# Patient Record
Sex: Male | Born: 1945 | Race: Black or African American | Hispanic: No | Smoking: Never smoker
Health system: Southern US, Community
[De-identification: ages and names within clinical notes are randomized; demographics above are authoritative.]

## PROBLEM LIST (undated history)

## (undated) DIAGNOSIS — E119 Type 2 diabetes mellitus without complications: Secondary | ICD-10-CM

## (undated) DIAGNOSIS — G4730 Sleep apnea, unspecified: Secondary | ICD-10-CM

## (undated) DIAGNOSIS — I10 Essential (primary) hypertension: Secondary | ICD-10-CM

---

## 2013-11-17 ENCOUNTER — Emergency Department (HOSPITAL_COMMUNITY)
Admission: EM | Admit: 2013-11-17 | Discharge: 2013-11-18 | Disposition: A | Discharge status: Home / Self Care | Payer: Medicare HMO | Attending: Emergency Medicine | Admitting: Emergency Medicine

## 2013-11-17 ENCOUNTER — Encounter (HOSPITAL_COMMUNITY): Payer: Self-pay | Admitting: Emergency Medicine

## 2013-11-17 DIAGNOSIS — T7809XA Anaphylactic reaction due to other food products, initial encounter: Secondary | ICD-10-CM | POA: Insufficient documentation

## 2013-11-17 DIAGNOSIS — E119 Type 2 diabetes mellitus without complications: Secondary | ICD-10-CM | POA: Insufficient documentation

## 2013-11-17 DIAGNOSIS — Z7982 Long term (current) use of aspirin: Secondary | ICD-10-CM | POA: Insufficient documentation

## 2013-11-17 DIAGNOSIS — I10 Essential (primary) hypertension: Secondary | ICD-10-CM | POA: Insufficient documentation

## 2013-11-17 DIAGNOSIS — R21 Rash and other nonspecific skin eruption: Secondary | ICD-10-CM | POA: Insufficient documentation

## 2013-11-17 DIAGNOSIS — Y9389 Activity, other specified: Secondary | ICD-10-CM | POA: Diagnosis not present

## 2013-11-17 DIAGNOSIS — Z79899 Other long term (current) drug therapy: Secondary | ICD-10-CM | POA: Diagnosis not present

## 2013-11-17 DIAGNOSIS — T628X1A Toxic effect of other specified noxious substances eaten as food, accidental (unintentional), initial encounter: Secondary | ICD-10-CM | POA: Insufficient documentation

## 2013-11-17 DIAGNOSIS — Y92009 Unspecified place in unspecified non-institutional (private) residence as the place of occurrence of the external cause: Secondary | ICD-10-CM | POA: Insufficient documentation

## 2013-11-17 DIAGNOSIS — Z794 Long term (current) use of insulin: Secondary | ICD-10-CM | POA: Diagnosis not present

## 2013-11-17 DIAGNOSIS — T782XXA Anaphylactic shock, unspecified, initial encounter: Secondary | ICD-10-CM

## 2013-11-17 HISTORY — DX: Type 2 diabetes mellitus without complications: E11.9

## 2013-11-17 HISTORY — DX: Essential (primary) hypertension: I10

## 2013-11-17 HISTORY — DX: Sleep apnea, unspecified: G47.30

## 2013-11-17 LAB — BASIC METABOLIC PANEL
ANION GAP: 10 (ref 5–15)
BUN: 17 mg/dL (ref 6–23)
CHLORIDE: 98 meq/L (ref 96–112)
CO2: 28 meq/L (ref 19–32)
CREATININE: 1.05 mg/dL (ref 0.50–1.35)
Calcium: 9 mg/dL (ref 8.4–10.5)
GFR calc Af Amer: 81 mL/min — ABNORMAL LOW (ref >90)
GFR calc non Af Amer: 70 mL/min — ABNORMAL LOW (ref >90)
Glucose, Bld: 217 mg/dL — ABNORMAL HIGH (ref 70–99)
POTASSIUM: 4 meq/L (ref 3.7–5.3)
SODIUM: 136 meq/L — AB (ref 137–147)

## 2013-11-17 LAB — CBC
HCT: 43.7 % (ref 39.0–52.0)
Hemoglobin: 14.5 g/dL (ref 13.0–17.0)
MCH: 29.1 pg (ref 26.0–34.0)
MCHC: 33.2 g/dL (ref 30.0–36.0)
MCV: 87.8 fL (ref 78.0–100.0)
PLATELETS: 169 10*3/uL (ref 150–400)
RBC: 4.98 MIL/uL (ref 4.22–5.81)
RDW: 12.4 % (ref 11.5–15.5)
WBC: 12.9 10*3/uL — AB (ref 4.0–10.5)

## 2013-11-17 LAB — CBG MONITORING, ED: Glucose-Capillary: 161 mg/dL — ABNORMAL HIGH (ref 70–99)

## 2013-11-17 MED ORDER — METHYLPREDNISOLONE SODIUM SUCC 125 MG IJ SOLR
125.0000 mg | Freq: Once | INTRAMUSCULAR | Status: AC
  Administered 2013-11-17: 125 mg via INTRAVENOUS
  Filled 2013-11-17: qty 2

## 2013-11-17 MED ORDER — DIPHENHYDRAMINE HCL 25 MG PO TABS: 25.0000 mg | ORAL_TABLET | Freq: Four times a day (QID) | ORAL | Status: AC | PRN

## 2013-11-17 MED ORDER — EPINEPHRINE 0.3 MG/0.3ML IJ SOAJ
0.3000 mg | Freq: Once | INTRAMUSCULAR | Status: AC
  Administered 2013-11-17: 0.3 mg via INTRAMUSCULAR
  Filled 2013-11-17: qty 0.3

## 2013-11-17 MED ORDER — PREDNISONE 50 MG PO TABS: 50.0000 mg | ORAL_TABLET | Freq: Every day | ORAL | Status: AC

## 2013-11-17 MED ORDER — FAMOTIDINE IN NACL 20-0.9 MG/50ML-% IV SOLN
20.0000 mg | Freq: Once | INTRAVENOUS | Status: AC
  Administered 2013-11-17: 20 mg via INTRAVENOUS
  Filled 2013-11-17: qty 50

## 2013-11-17 MED ORDER — EPINEPHRINE 0.3 MG/0.3ML IJ SOAJ: 0.3000 mg | INTRAMUSCULAR | Status: AC | PRN

## 2013-11-17 MED ORDER — FAMOTIDINE 20 MG PO TABS: 20.0000 mg | ORAL_TABLET | Freq: Two times a day (BID) | ORAL | Status: AC

## 2013-11-17 MED ORDER — SODIUM CHLORIDE 0.9 % IV SOLN
1000.0000 mL | INTRAVENOUS | Status: DC
  Administered 2013-11-17: 1000 mL via INTRAVENOUS

## 2013-11-17 MED ORDER — SODIUM CHLORIDE 0.9 % IV SOLN
1000.0000 mL | Freq: Once | INTRAVENOUS | Status: AC
  Administered 2013-11-17: 1000 mL via INTRAVENOUS

## 2013-11-17 NOTE — ED Notes (Signed)
Patient from in home via EMS. Patient reports eating cold slaw that had poppy seeds and mustard. EMS states that upon arrival that was on the floor clammy, hives noted on bilateral arms and stomach with BP of 70/36 and he felt the room was dark. He was given 500 ml fluid bolus with improvement in pressure. Patient also has swelling to eye and lips. Patient denies SOB, nausea, chest pain, and no LOC. Patient did not report problems swallowing. He was given 50 mg benadryl by family.

## 2013-11-17 NOTE — Discharge Instructions (Signed)
Anaphylactic Reaction °An anaphylactic reaction is a sudden, severe allergic reaction. It affects the whole body. It can be life threatening. You may need to stay in the hospital.  °HOME CARE °· Wear a medical bracelet or necklace that lists your allergy. °· Carry your allergy kit or medicine shot to treat severe allergic reactions with you. These can save your life. °· Do not drive until medicine from your shot has worn off, unless your doctor says it is okay. °· If you have hives or a rash: °¨ Take medicine as told by your doctor. °¨ You may take over-the-counter antihistamine medicine. °¨ Place cold cloths on your skin. Take baths in cool water. Avoid hot baths and hot showers. °GET HELP RIGHT AWAY IF:  °· Your mouth is puffy (swollen), or you have trouble breathing. °· You start making whistling sounds when you breathe (wheezing). °· You have a tight feeling in your chest or throat. °· You have a rash, hives, puffiness, or itching on your body. °· You throw up (vomit) or have watery poop (diarrhea). °· You feel dizzy or pass out (faint). °· You think you are having an allergic reaction. °· You have new symptoms. °This is an emergency. Use your medicine shot or allergy kit as told. Call your local emergency services (911 in [place] Even if you feel better after the shot, you need to go to the hospital emergency department. °MAKE SURE YOU:  °· Understand these instructions. °· Will watch your condition. °· Will get help right away if you are not doing well or get worse. °Document Released: 08/10/2007 Document Revised: 08/23/2011 Document Reviewed: 05/25/2011 °Exit[employer]® Patient Information ©2015 [employer]. This information is not intended to replace advice given to you by your health care provider. Make sure you discuss any questions you have with your health care provider. ° °

## 2013-11-17 NOTE — ED Notes (Signed)
On assessment patient has some swelling to eye and lips. Patient denies any SOB or problem swallowing. He states he has had a reaction like this before in [place] from same food.

## 2013-11-17 NOTE — ED Provider Notes (Signed)
CSN: [id number]     Arrival date & time 11/17/13  2106 History   First MD Initiated Contact with Patient 11/17/13 2134     Chief Complaint  Patient presents with  . Allergic Reaction    HPI After eating dinner he went to take his medications.  He suddenly started having itching in both palms and arms.  He tried to wash his hands but the symptoms persisted.  He felt very flushed and noticed a rash all over.  He tried taking a couple of benadryl.  He started to feel like he was choking.  He became lightheaded and then fell to the floor. EMS was called  He was hypotensive and they gave him iv fluids and transported him to the ED. Past Medical History  Diagnosis Date  . Diabetes mellitus without complication   . Hypertension   . Sleep apnea    History reviewed. No pertinent past surgical history. No family history on file. History  Substance Use Topics  . Smoking status: Never Smoker   . Smokeless tobacco: Not on file  . Alcohol Use: No    Review of Systems  All other systems reviewed and are negative.     Allergies  Review of patient's allergies indicates no known allergies.  Home Medications   Prior to Admission medications   Medication Sig Start Date End Date Taking? Authorizing Provider  aspirin 325 MG tablet Take 325 mg by mouth daily.   Yes Historical Provider, MD  atenolol (TENORMIN) 50 MG tablet Take 50 mg by mouth daily.   Yes Historical Provider, MD  beclomethasone (QVAR) 80 MCG/ACT inhaler Inhale 2 puffs into the lungs 2 (two) times daily.   Yes Historical Provider, MD  hydrochlorothiazide (MICROZIDE) 12.5 MG capsule Take 12.5 mg by mouth 2 (two) times daily.   Yes Historical Provider, MD  insulin NPH Human (HUMULIN N,NOVOLIN N) 100 UNIT/ML injection Inject 10-15 Units into the skin 2 (two) times daily before a meal. 15 units in the am in 10 units in the pm   Yes Historical Provider, MD  lisinopril (PRINIVIL,ZESTRIL) 10 MG tablet Take 10 mg by mouth daily.   Yes  Historical Provider, MD  lovastatin (MEVACOR) 20 MG tablet Take 20 mg by mouth daily.   Yes Historical Provider, MD  ranitidine (ZANTAC) 150 MG tablet Take 150 mg by mouth daily.   Yes Historical Provider, MD  tetrahydrozoline 0.05 % ophthalmic solution Place 1 drop into both eyes 2 (two) times daily as needed (dry eyes).   Yes Historical Provider, MD  diphenhydrAMINE (BENADRYL) 25 MG tablet Take 1 tablet (25 mg total) by mouth every 6 (six) hours as needed. 11/17/13   Linwood Dibbles, MD  EPINEPHrine (EPIPEN) 0.3 mg/0.3 mL IJ SOAJ injection Inject 0.3 mLs (0.3 mg total) into the muscle as needed. 11/17/13   Linwood Dibbles, MD  famotidine (PEPCID) 20 MG tablet Take 1 tablet (20 mg total) by mouth 2 (two) times daily. 11/17/13   Linwood Dibbles, MD  predniSONE (DELTASONE) 50 MG tablet Take 1 tablet (50 mg total) by mouth daily. 11/17/13   Linwood Dibbles, MD   BP 137/62  Pulse 71  Temp(Src) 98.1 F (36.7 C) (Oral)  Resp 15  SpO2 100% Physical Exam  Nursing note and vitals reviewed. Constitutional: He appears well-nourished. No distress.  HENT:  Head: Normocephalic and atraumatic.  Right Ear: External ear normal.  Left Ear: External ear normal.  Mouth/Throat: No oral lesions. No uvula swelling. No oropharyngeal exudate, posterior oropharyngeal  edema or posterior oropharyngeal erythema.  Eyes: Conjunctivae are normal. Right eye exhibits no discharge. Left eye exhibits no discharge. No scleral icterus.  Neck: Neck supple. No tracheal deviation present.  Cardiovascular: Normal rate, regular rhythm and intact distal pulses.   Pulmonary/Chest: Effort normal and breath sounds normal. No stridor. No respiratory distress. He has no wheezes. He has no rales.  Abdominal: Soft. Bowel sounds are normal. He exhibits no distension. There is no tenderness. There is no rebound and no guarding.  Musculoskeletal: He exhibits no edema and no tenderness.  Neurological: He is alert. He has normal strength. No cranial nerve deficit (no  facial droop, extraocular movements intact, no slurred speech) or sensory deficit. He exhibits normal muscle tone. He displays no seizure activity. Coordination normal.  Skin: Skin is warm and dry. Rash noted. Rash is urticarial. He is not diaphoretic.  Facial erythema, erythema of the neck and urticaria on the upper extremities  Psychiatric: He has a normal mood and affect.    ED Course  Procedures (including critical care time) Labs Review Labs Reviewed  CBC - Abnormal; Notable for the following:    WBC 12.9 (*)    All other components within normal limits  BASIC METABOLIC PANEL - Abnormal; Notable for the following:    Sodium 136 (*)    Glucose, Bld 217 (*)    GFR calc non Af Amer 70 (*)    GFR calc Af Amer 81 (*)    All other components within normal limits  CBG MONITORING, ED - Abnormal; Notable for the following:    Glucose-Capillary 161 (*)    All other components within normal limits    Medications  0.9 %  sodium chloride infusion (0 mLs Intravenous Stopped 11/17/13 2236)    Followed by  0.9 %  sodium chloride infusion (1,000 mLs Intravenous New Bag/Given 11/17/13 2235)  EPINEPHrine (EPI-PEN) injection 0.3 mg (0.3 mg Intramuscular Given 11/17/13 2200)  famotidine (PEPCID) IVPB 20 mg (0 mg Intravenous Stopped 11/17/13 2235)  methylPREDNISolone sodium succinate (SOLU-MEDROL) 125 mg/2 mL injection 125 mg (125 mg Intravenous Given 11/17/13 2159)     MDM   Final diagnoses:  Anaphylaxis, initial encounter    Pt was monitored in the ED for several hours.  Urticaria and his symptoms resolved.  Breathing easily.  No stridor.  Will dc home with referral to an allergist.  DC home on steroids, antihistamines   Linwood Dibbles, MD 11/17/13 2355

## 2018-05-29 ENCOUNTER — Other Ambulatory Visit: Payer: Self-pay | Admitting: Family Medicine

## 2018-05-29 DIAGNOSIS — R918 Other nonspecific abnormal finding of lung field: Secondary | ICD-10-CM

## 2018-06-05 ENCOUNTER — Inpatient Hospital Stay: Admission: RE | Admit: 2018-06-05 | Payer: Medicare HMO | Source: Ambulatory Visit

## 2018-06-14 ENCOUNTER — Other Ambulatory Visit: Payer: Self-pay

## 2018-06-14 ENCOUNTER — Ambulatory Visit
Admission: RE | Admit: 2018-06-14 | Discharge: 2018-06-14 | Disposition: A | Discharge status: Home / Self Care | Payer: Medicare HMO | Source: Ambulatory Visit | Attending: Family Medicine | Admitting: Family Medicine

## 2018-06-14 DIAGNOSIS — R918 Other nonspecific abnormal finding of lung field: Secondary | ICD-10-CM

## 2018-06-14 MED ORDER — IOPAMIDOL (ISOVUE-300) INJECTION 61%
75.0000 mL | Freq: Once | INTRAVENOUS | Status: AC | PRN
  Administered 2018-06-14: 14:00:00 75 mL via INTRAVENOUS

## 2019-05-23 ENCOUNTER — Ambulatory Visit: Payer: Medicare HMO | Attending: Internal Medicine

## 2019-05-23 DIAGNOSIS — Z23 Encounter for immunization: Secondary | ICD-10-CM

## 2019-05-23 NOTE — Progress Notes (Signed)
   Covid-19 Vaccination Clinic  Name:  Paul Sanchez    MRN: [id number] DOB: 02-10-1946  05/23/2019  Mr. Paul Sanchez was observed post Covid-19 immunization for 15 minutes without incident. He was provided with Vaccine Information Sheet and instruction to access the V-Safe system.   Mr. Paul Sanchez was instructed to call 911 with any severe reactions post vaccine: Marland Kitchen Difficulty breathing  . Swelling of face and throat  . A fast heartbeat  . A bad rash all over body  . Dizziness and weakness   Immunizations Administered    Name Date Dose VIS Date Route   Pfizer COVID-19 Vaccine 05/23/2019  3:05 PM 0.3 mL 02/15/2019 Intramuscular   Manufacturer: [employer], [employer]   Lot: [id number]   NDC: [address]-7

## 2019-06-18 ENCOUNTER — Ambulatory Visit: Payer: Medicare HMO | Attending: Internal Medicine

## 2019-06-18 DIAGNOSIS — Z23 Encounter for immunization: Secondary | ICD-10-CM

## 2019-06-18 NOTE — Progress Notes (Signed)
   Covid-19 Vaccination Clinic  Name:  Paul Sanchez    MRN: [id number] DOB: 11/04/1945  06/18/2019  [name] was observed post Covid-19 immunization for 15 minutes without incident. He was provided with Vaccine Information Sheet and instruction to access the V-Safe system.   [name] was instructed to call 911 with any severe reactions post vaccine: Marland Kitchen Difficulty breathing  . Swelling of face and throat  . A fast heartbeat  . A bad rash all over body  . Dizziness and weakness   Immunizations Administered    Name Date Dose VIS Date Route   Pfizer COVID-19 Vaccine 06/18/2019  1:56 PM 0.3 mL 02/15/2019 Intramuscular   Manufacturer: [employer], [employer]   Lot: [id number]   NDC: [address]-2

## 2020-05-13 IMAGING — CT CT CHEST WITH CONTRAST
1 series · 14 of 34 positions shown, 18 images · IV contrast (APPLIED)
Comparison: None.

CLINICAL DATA: Lung mass.

EXAM:
CT CHEST WITH CONTRAST
TECHNIQUE: Multidetector CT imaging of the chest was performed during
intravenous contrast administration.
CONTRAST:  75mL A5FBUF-LEE IOPAMIDOL (A5FBUF-LEE) INJECTION 61%

[Series 2: chest w/cm · axial · 0.72mm/px · z∈[-336,-60]mm · 14 of 163 slices shown, 18 images]
[im 13/163  mediastinal]
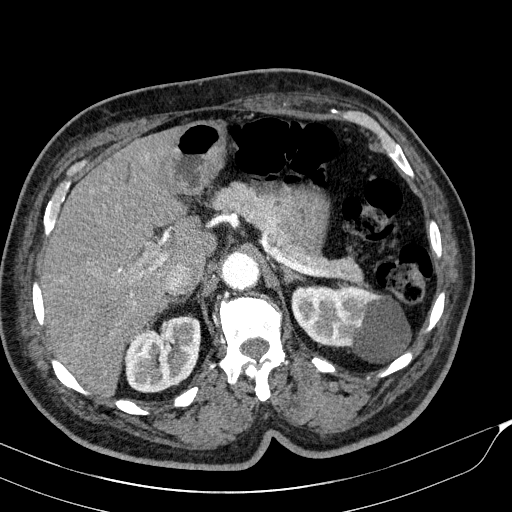
[im 13/163  lung]
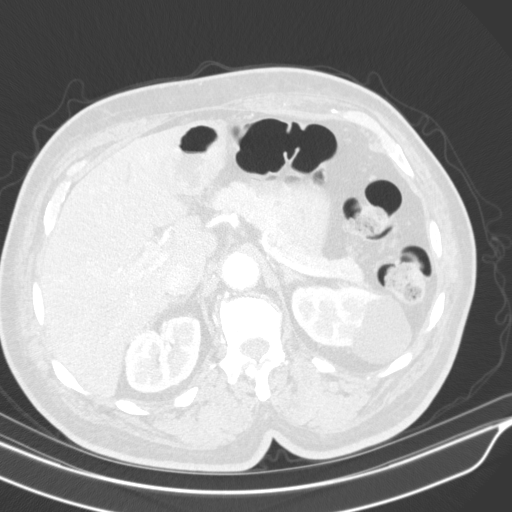
[im 25/163  lung]
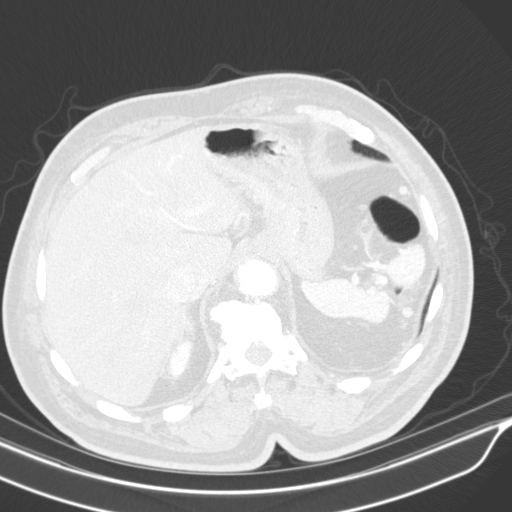
[im 33/163  lung]
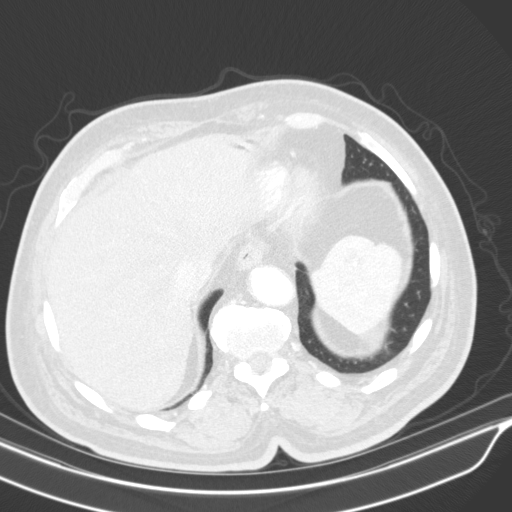
[im 49/163  lung]
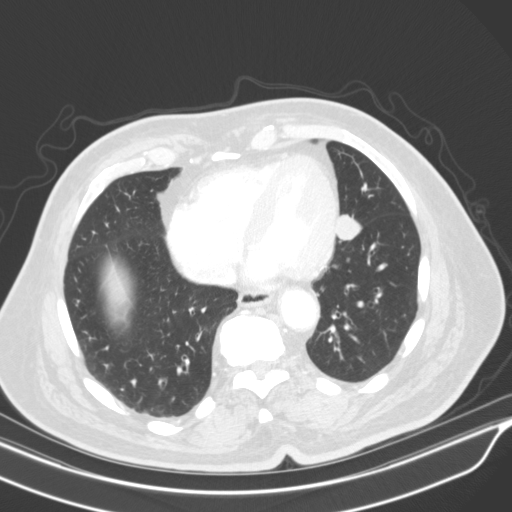
[im 61/163  mediastinal]
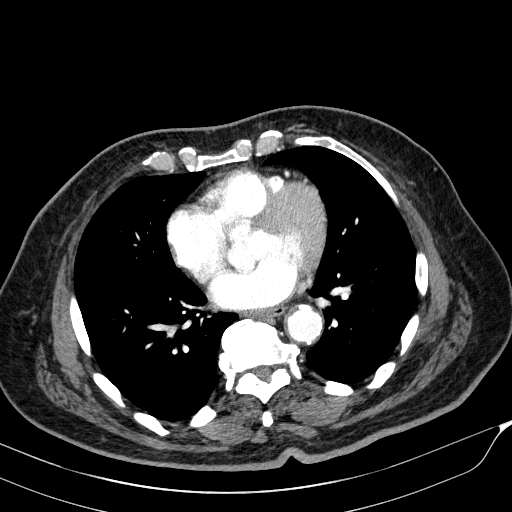
[im 61/163  lung]
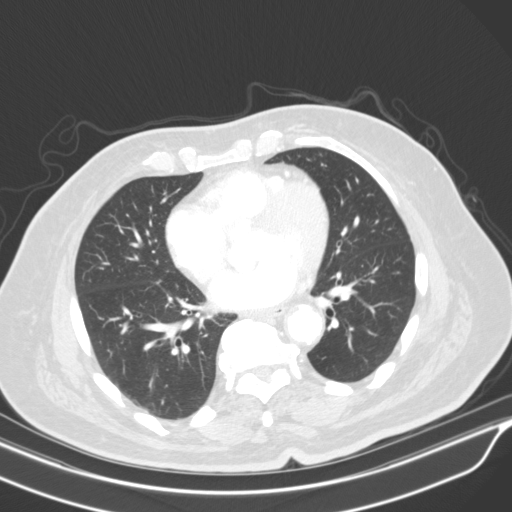
[im 67/163  lung]
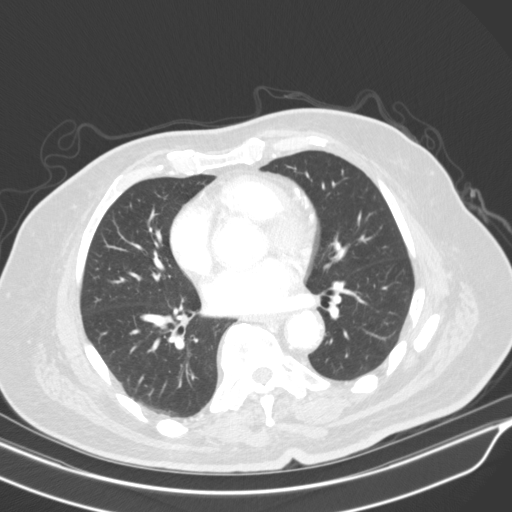
[im 77/163  lung]
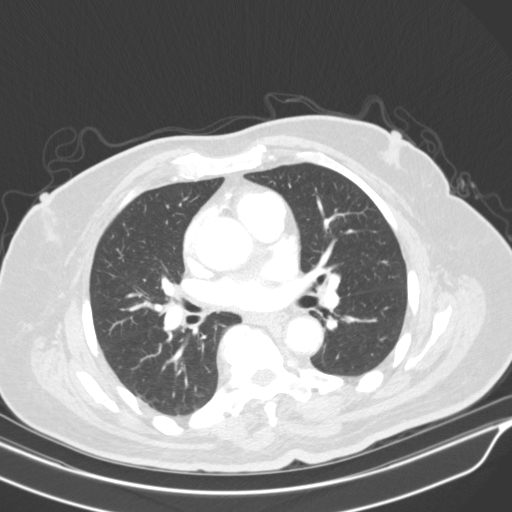
[im 87/163  lung]
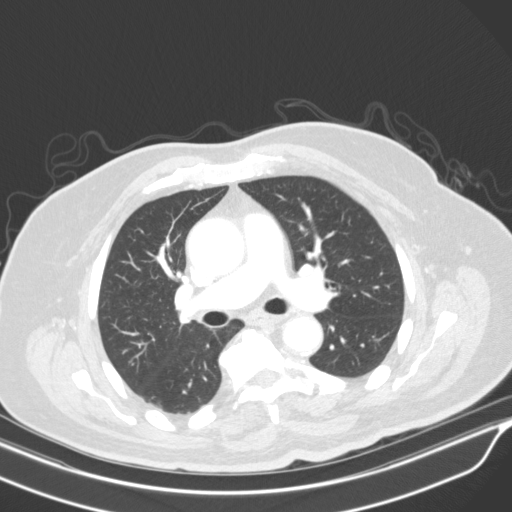
[im 97/163  mediastinal]
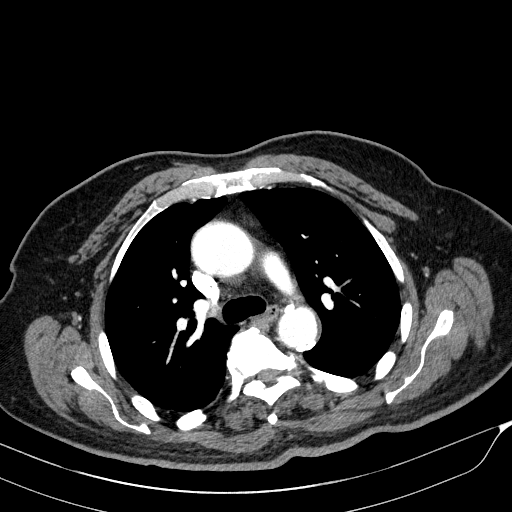
[im 97/163  lung]
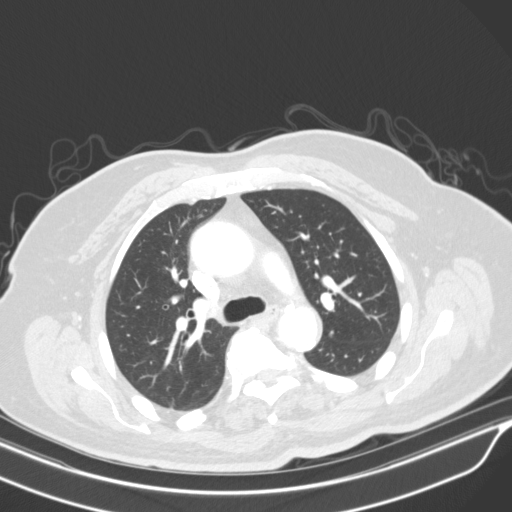
[im 103/163  lung]
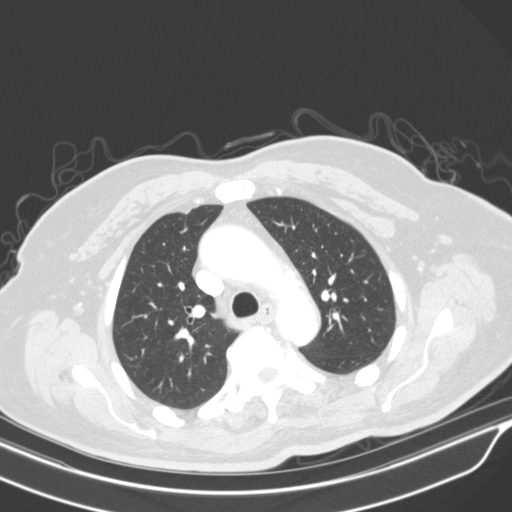
[im 121/163  lung]
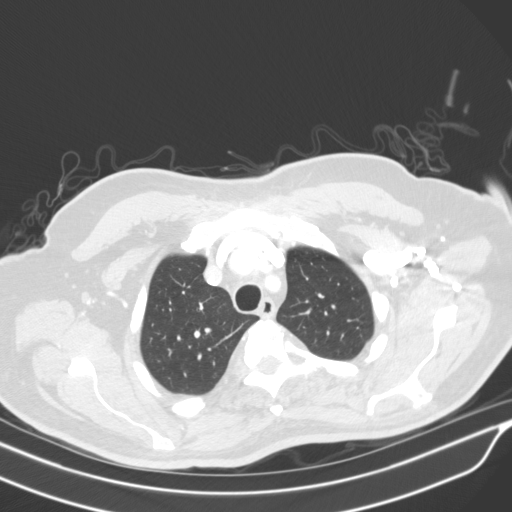
[im 130/163  lung]
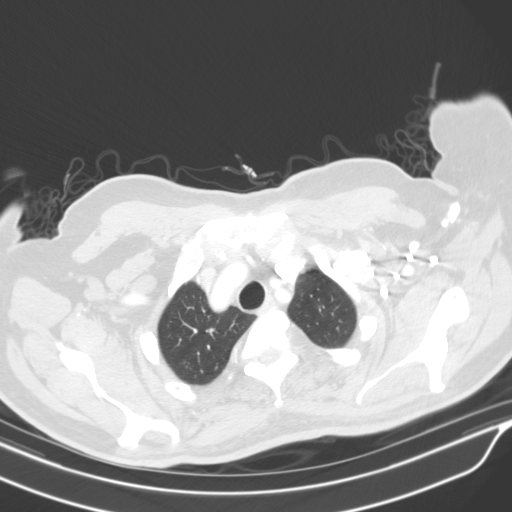
[im 139/163  mediastinal]
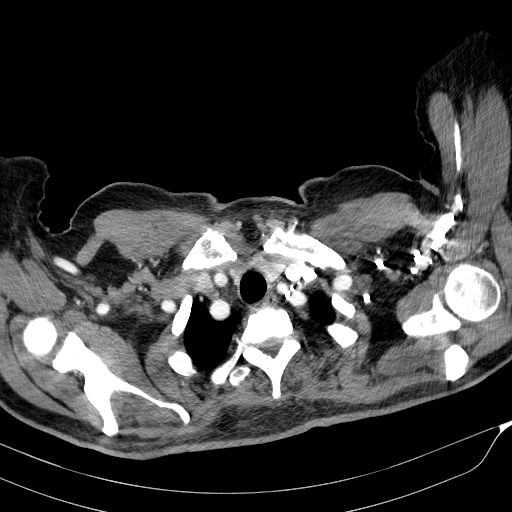
[im 139/163  lung]
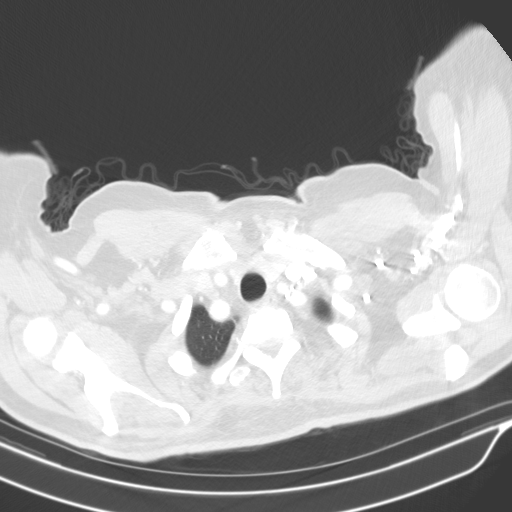
[im 151/163  lung]
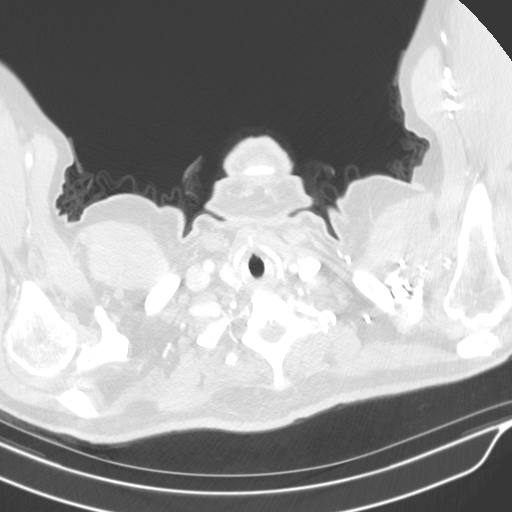

[14 of 34 positions shown; findings below may reference images not displayed]

FINDINGS: Cardiovascular: The heart size is within normal limits. No
pericardial fluid identified. Extensive calcified coronary artery
plaque is noted throughout the LAD territory. Milder calcified
plaque present in the distribution of the left circumflex coronary
artery. The aortic valve is calcified. The aortic root measures
approximately 3.7 cm at the level of the sinuses of Valsalva. The
ascending thoracic aorta is mildly dilated and measures
approximately 4.1 cm in greatest diameter. No evidence of aortic
dissection. Atherosclerosis is noted at the level of the aortic arch
and descending thoracic aorta. Proximal great vessels show no
significant stenosis.

Mediastinum/Nodes: No enlarged mediastinal, hilar, or axillary lymph
nodes. Thyroid gland, trachea, and esophagus demonstrate no
significant findings.

Lungs/Pleura: There is a well-circumscribed mass in the inferior and
posterior aspect of the lingula demonstrating mildly lobulated
borders and uniform density and measuring approximately 2.2 x 2.3 x
2.4 cm. This mass abuts and causes some mass effect on the adjacent
major fissure and also abuts the pericardium and lateral left
ventricular myocardium. There is a 2 mm calcified granuloma in the
lateral right middle lobe.

Upper Abdomen: No adrenal masses or visible lymphadenopathy in the
upper abdomen. The liver demonstrates steatosis. There is a simple
cyst of the upper left kidney measuring approximately 5 cm.

Musculoskeletal: No bony lesions identified.  No fractures.
IMPRESSION: 1. Well-circumscribed and mildly lobulated lingular mass measuring
2.4 cm in greatest diameter. This mass exerts mass effect on the
major fissure posteriorly and abuts the pericardium and left
ventricular myocardium medially. Findings are concerning for primary
pulmonary neoplasm and further evaluation recommended with PET scan.
2. No associated enlarged lymph nodes identified in the chest.
3. Mild aneurysmal disease of the ascending thoracic aorta which
measures 4.1 cm in greatest diameter. The aortic valve is calcified
and correlation with echocardiography may be helpful to evaluate
aortic valve function. Recommend annual imaging followup by CTA or
MRA. This recommendation follows 9626
ACCF/AHA/AATS/ACR/ASA/SCA/BORGE/YAMASHITA/TIGER/FELDER Guidelines for the
Diagnosis and Management of Patients with Thoracic Aortic Disease.
Circulation. 9626; 121: E266-e369. Aortic aneurysm NOS (4EHZ5-SCL.Z)
4. Coronary atherosclerosis with prominent calcified plaque in the
distribution of the LAD.

Aortic aneurysm NOS (4EHZ5-SCL.Z).

## 2023-07-18 ENCOUNTER — Ambulatory Visit: Payer: Self-pay | Admitting: Podiatry

## 2023-07-20 ENCOUNTER — Encounter: Payer: Self-pay | Admitting: Podiatry

## 2023-07-20 ENCOUNTER — Ambulatory Visit (INDEPENDENT_AMBULATORY_CARE_PROVIDER_SITE_OTHER): Admitting: Podiatry

## 2023-07-20 DIAGNOSIS — M79672 Pain in left foot: Secondary | ICD-10-CM | POA: Diagnosis not present

## 2023-07-20 DIAGNOSIS — B351 Tinea unguium: Secondary | ICD-10-CM

## 2023-07-20 DIAGNOSIS — B353 Tinea pedis: Secondary | ICD-10-CM

## 2023-07-20 DIAGNOSIS — M79671 Pain in right foot: Secondary | ICD-10-CM | POA: Diagnosis not present

## 2023-07-20 MED ORDER — KETOCONAZOLE 2 % EX CREA: 1.0000 | TOPICAL_CREAM | Freq: Every day | CUTANEOUS | 3 refills | Status: AC

## 2023-07-20 MED ORDER — TERBINAFINE HCL 250 MG PO TABS: 250.0000 mg | ORAL_TABLET | Freq: Every day | ORAL | 0 refills | Status: AC

## 2023-07-20 NOTE — Progress Notes (Signed)
   Subjective:    HPI Presents for follow-up onychomycosis treatment with p.o. Lamisil.  No problems taking medicine with no side effects noted.  Tenderness around toes with walking and wearing shoes.   Objective:  Physical Exam   General: AAO x3, NAD  Vascular: DP and PT pulses palpable bilaterally.  Immedate capillary fill time digits. No significant lower extremity edema bilaterally.  Dermatological: Onychomycotic mycotic changes nails 1 through 5 with discoloration nail and subungual debris and thickening of the nail. 0% Clearance of onychomycotic nail changes noted. Tenderness with walking and wearing shoes.  Some maceration with small bullae in the fourth webspace on the right foot.  Neruologic: Grossly intact B/L  Musculoskeletal:   Assessment:  Painful onychomycotic nails 1 through 5 bilaterally. Pain feet b/l 3.  Tinea pedis bilaterally.    Plan:  -Established office visit level 3 for evaluation and management -Patient is tolerating Lamisil treatment for onychomycotic nails well.  No side effects noted. Will continue this treatment.  Describe him topical ketoconazole for the athlete's foot on the right foot.  Told him if he is using other areas like this and the other interdigital spaces he use at all they are also -Rx: Lamisil 250 mg p.o. daily -Rx: Ketoconazole apply twice daily to affected area right foot.  90 g 2 refills - Labs ordered today for liver function tests to monitor for any hepatic side effects from the Lamisil.  - Return in 4 weeks 3rd Lamisil.

## 2023-08-17 ENCOUNTER — Ambulatory Visit: Payer: Self-pay | Admitting: Podiatry
